# Patient Record
Sex: Male | Born: 1985 | Race: White | Hispanic: No | Marital: Married | Smoking: Former smoker
Health system: Southern US, Community
[De-identification: ages and names within clinical notes are randomized; demographics above are authoritative.]

## PROBLEM LIST (undated history)

## (undated) HISTORY — PX: HERNIA REPAIR: SHX51

---

## 2014-03-02 ENCOUNTER — Emergency Department (HOSPITAL_COMMUNITY): Payer: BC Managed Care – PPO

## 2014-03-02 ENCOUNTER — Emergency Department (HOSPITAL_COMMUNITY)
Admission: EM | Admit: 2014-03-02 | Discharge: 2014-03-03 | Disposition: A | Discharge status: Home / Self Care | Payer: BC Managed Care – PPO | Attending: Emergency Medicine | Admitting: Emergency Medicine

## 2014-03-02 ENCOUNTER — Encounter (HOSPITAL_COMMUNITY): Payer: Self-pay | Admitting: Emergency Medicine

## 2014-03-02 DIAGNOSIS — M778 Other enthesopathies, not elsewhere classified: Secondary | ICD-10-CM

## 2014-03-02 DIAGNOSIS — S6990XA Unspecified injury of unspecified wrist, hand and finger(s), initial encounter: Secondary | ICD-10-CM | POA: Diagnosis present

## 2014-03-02 DIAGNOSIS — W268XXA Contact with other sharp object(s), not elsewhere classified, initial encounter: Secondary | ICD-10-CM | POA: Insufficient documentation

## 2014-03-02 DIAGNOSIS — M65849 Other synovitis and tenosynovitis, unspecified hand: Secondary | ICD-10-CM

## 2014-03-02 DIAGNOSIS — Y9389 Activity, other specified: Secondary | ICD-10-CM | POA: Insufficient documentation

## 2014-03-02 DIAGNOSIS — S60552A Superficial foreign body of left hand, initial encounter: Secondary | ICD-10-CM

## 2014-03-02 DIAGNOSIS — M65839 Other synovitis and tenosynovitis, unspecified forearm: Secondary | ICD-10-CM | POA: Diagnosis not present

## 2014-03-02 DIAGNOSIS — Z87891 Personal history of nicotine dependence: Secondary | ICD-10-CM | POA: Insufficient documentation

## 2014-03-02 DIAGNOSIS — Y9289 Other specified places as the place of occurrence of the external cause: Secondary | ICD-10-CM | POA: Diagnosis not present

## 2014-03-02 DIAGNOSIS — M779 Enthesopathy, unspecified: Secondary | ICD-10-CM

## 2014-03-02 DIAGNOSIS — S60459A Superficial foreign body of unspecified finger, initial encounter: Secondary | ICD-10-CM | POA: Diagnosis not present

## 2014-03-02 NOTE — ED Notes (Signed)
Pt reports was cutting wood 3.5 years ago and piece of axe broke off and embedded into L thumb; pt reports it has been there since but never caused problems until now; having difficulty moving thumb

## 2014-03-02 NOTE — ED Provider Notes (Signed)
CSN: [id number]     Arrival date & time 03/02/14  2211 History  This chart was scribed for non-physician practitioner, Dierdre Forth PA-C, working with Toy Baker, MD by Milly Jakob, ED Scribe. The patient was seen in room TR10C/TR10C. Patient's care was started at 11:40 PM.  Chief Complaint  Patient presents with  . Hand Pain   The history is provided by the patient and medical records. No language interpreter was used.   HPI Comments: Brandon Shea is a 28 y.o. male who presents to the Emergency Department complaining of pain and swelling in his left hand where he has a piece of steel that has been lodged near his thumb. He reports that he was chopping wood and the tip of an axe broke off and became trapped in his hand 3 years ago. He states that it has not been painful in the past, but this past week it has become more swollen and acutely painful. He denies known trauma.  He reports that pain shoots through his hand upon palpation or bending. He denies any medical problems.    History reviewed. No pertinent past medical history. Past Surgical History  Procedure Laterality Date  . Hernia repair     History reviewed. No pertinent family history. History  Substance Use Topics  . Smoking status: Former Smoker    Quit date: 03/02/2005  . Smokeless tobacco: Current User    Types: Chew  . Alcohol Use: Yes     Comment: occasionally    Review of Systems  Constitutional: Negative for fever and chills.  Gastrointestinal: Negative for nausea and vomiting.  Musculoskeletal: Positive for arthralgias (left thumb) and joint swelling. Negative for back pain, neck pain and neck stiffness.  Skin: Negative for wound.  Neurological: Negative for weakness and numbness.  Hematological: Does not bruise/bleed easily.  Psychiatric/Behavioral: The patient is not nervous/anxious.   All other systems reviewed and are negative.  Allergies  Aspirin  Home Medications   Prior to Admission  medications   Not on File   Triage Vitals: BP 128/74  Pulse 79  Temp(Src) 98.3 F (36.8 C)  Resp 18  Ht 6' (1.829 m)  Wt 210 lb (95.255 kg)  BMI 28.47 kg/m2  SpO2 96% Physical Exam  Nursing note and vitals reviewed. Constitutional: He appears well-developed and well-nourished. No distress.  HENT:  Head: Normocephalic and atraumatic.  Eyes: Conjunctivae are normal.  Neck: Normal range of motion.  Cardiovascular: Normal rate, regular rhythm, normal heart sounds and intact distal pulses.   No murmur heard. Capillary refill < 3 seconds.  Pulmonary/Chest: Effort normal and breath sounds normal.  Musculoskeletal: He exhibits tenderness. He exhibits no edema.  ROM: full range of motion to the dip, pip, and mcp of the left thumb. Palpable foreign body over the mcp w/ mild associated swelling. No erythema, induration, or increased warmth. No gross abscess.  Foreign body appears to be rubbing against the extensor tendon of the thumb Negative grind test to the right thumb  Neurological: He is alert. Coordination normal.  Sensation normal Strength 5/5 including resisted flexion and extension of the thumb. Strong and equal grip strength.  Skin: Skin is warm and dry. He is not diaphoretic.  No tenting of the skin  Psychiatric: He has a normal mood and affect.   ED Course  Procedures (including critical care time) DIAGNOSTIC STUDIES: Oxygen Saturation is 96% on room air, normal by my interpretation.    COORDINATION OF CARE: 11:46 PM-Discussed treatment plan  which includes f/u w/ a hand surgeon with pt at bedside and pt agreed to plan.   Labs Review Labs Reviewed - No data to display  Imaging Review Dg Hand Complete Left  03/02/2014   CLINICAL DATA:  Left hand pain.  EXAM: LEFT HAND - COMPLETE 3+ VIEW  COMPARISON:  None available.  FINDINGS: The joint spaces are maintained. No acute bony findings or degenerative changes. There is a metallic radiopaque foreign body near the base of the  thumb.  IMPRESSION: No acute bony findings.  Metallic foreign body noted near the base of the thumb.   Electronically Signed   By: Loralie ChampagneMark  Gallerani M.D.   On: 03/02/2014 23:04     EKG Interpretation None      MDM   Final diagnoses:  Foreign body, hand, superficial, left, initial encounter  Thumb tendonitis   Brandon PietyDonald Shea presents with 3 years of retained foreign body in the thumb with acutely worsening pain in the last week.  Patient without erythema or induration to suggest secondary infection, abscess.  Patient requests removal of the foreign body however based on its location secondary to the tendon do not feel this is safe for the emergency department. Patient pain likely secondary to tendinitis from tendon rubbing against the foreign body.  Full range of motion of all joints in the right hand. No evidence of septic joint.  Recommended patient followup with hand surgery when able for discussion of possible removal if it is giving him problems now.  In the meantime recommend ibuprofen 3 times per day with food to improve tendinitis.  Patient is to return to the emergency department for increasing erythema, increasing pain, fever or evidence of infection.  BP 109/52  Pulse 75  Temp(Src) 98.7 F (37.1 C) (Oral)  Resp 18  Ht 6' (1.829 m)  Wt 210 lb (95.255 kg)  BMI 28.47 kg/m2  SpO2 99%  I personally performed the services described in this documentation, which was scribed in my presence. The recorded information has been reviewed and is accurate.   Dahlia ClientHannah Jenniferlynn Saad, PA-C 03/03/14 0022

## 2014-03-02 NOTE — Discharge Instructions (Signed)
1. Medications: ibuprofen 800mg  three times per day with food, usual home medications 2. Treatment: rest, drink plenty of fluids,  3. Follow Up: Please followup with Dr. Mina MarbleWeingold to discuss possible removal of the FB    Tendinitis Tendinitis is swelling and inflammation of the tendons. Tendons are band-like tissues that connect muscle to bone. Tendinitis commonly occurs in the:   Shoulders (rotator cuff).  Heels (Achilles tendon).  Elbows (triceps tendon). CAUSES Tendinitis is usually caused by overusing the tendon, muscles, and joints involved. When the tissue surrounding a tendon (synovium) becomes inflamed, it is called tenosynovitis. Tendinitis commonly develops in people whose jobs require repetitive motions. SYMPTOMS  Pain.  Tenderness.  Mild swelling. DIAGNOSIS Tendinitis is usually diagnosed by physical exam. Your health care provider may also order X-rays or other imaging tests. TREATMENT Your health care provider may recommend certain medicines or exercises for your treatment. HOME CARE INSTRUCTIONS   Use a sling or splint for as long as directed by your health care provider until the pain decreases.  Put ice on the injured area.  Put ice in a plastic bag.  Place a towel between your skin and the bag.  Leave the ice on for 15-20 minutes, 3-4 times a day, or as directed by your health care provider.  Avoid using the limb while the tendon is painful. Perform gentle range of motion exercises only as directed by your health care provider. Stop exercises if pain or discomfort increase, unless directed otherwise by your health care provider.  Only take over-the-counter or prescription medicines for pain, discomfort, or fever as directed by your health care provider. SEEK MEDICAL CARE IF:   Your pain and swelling increase.  You develop new, unexplained symptoms, especially increased numbness in the hands. MAKE SURE YOU:   Understand these instructions.  Will  watch your condition.  Will get help right away if you are not doing well or get worse. Document Released: 06/28/2000 Document Revised: 11/15/2013 Document Reviewed: 09/17/2010 [facility] Patient Information 2015 [employer] [place]. This information is not intended to replace advice given to you by your health care provider. Make sure you discuss any questions you have with your health care provider.

## 2014-03-03 NOTE — ED Provider Notes (Signed)
Medical screening examination/treatment/procedure(s) were performed by non-physician practitioner and as supervising physician I was immediately available for consultation/collaboration.   Yanique Mulvihill T Jovan Colligan, MD 03/03/14 2258 

## 2015-03-07 IMAGING — CR DG HAND COMPLETE 3+V*L*
3 series · 3 of 3 positions shown · non-contrast
Comparison: None available.

CLINICAL DATA: Left hand pain.

EXAM:
LEFT HAND - COMPLETE 3+ VIEW

[x hand pa left *]
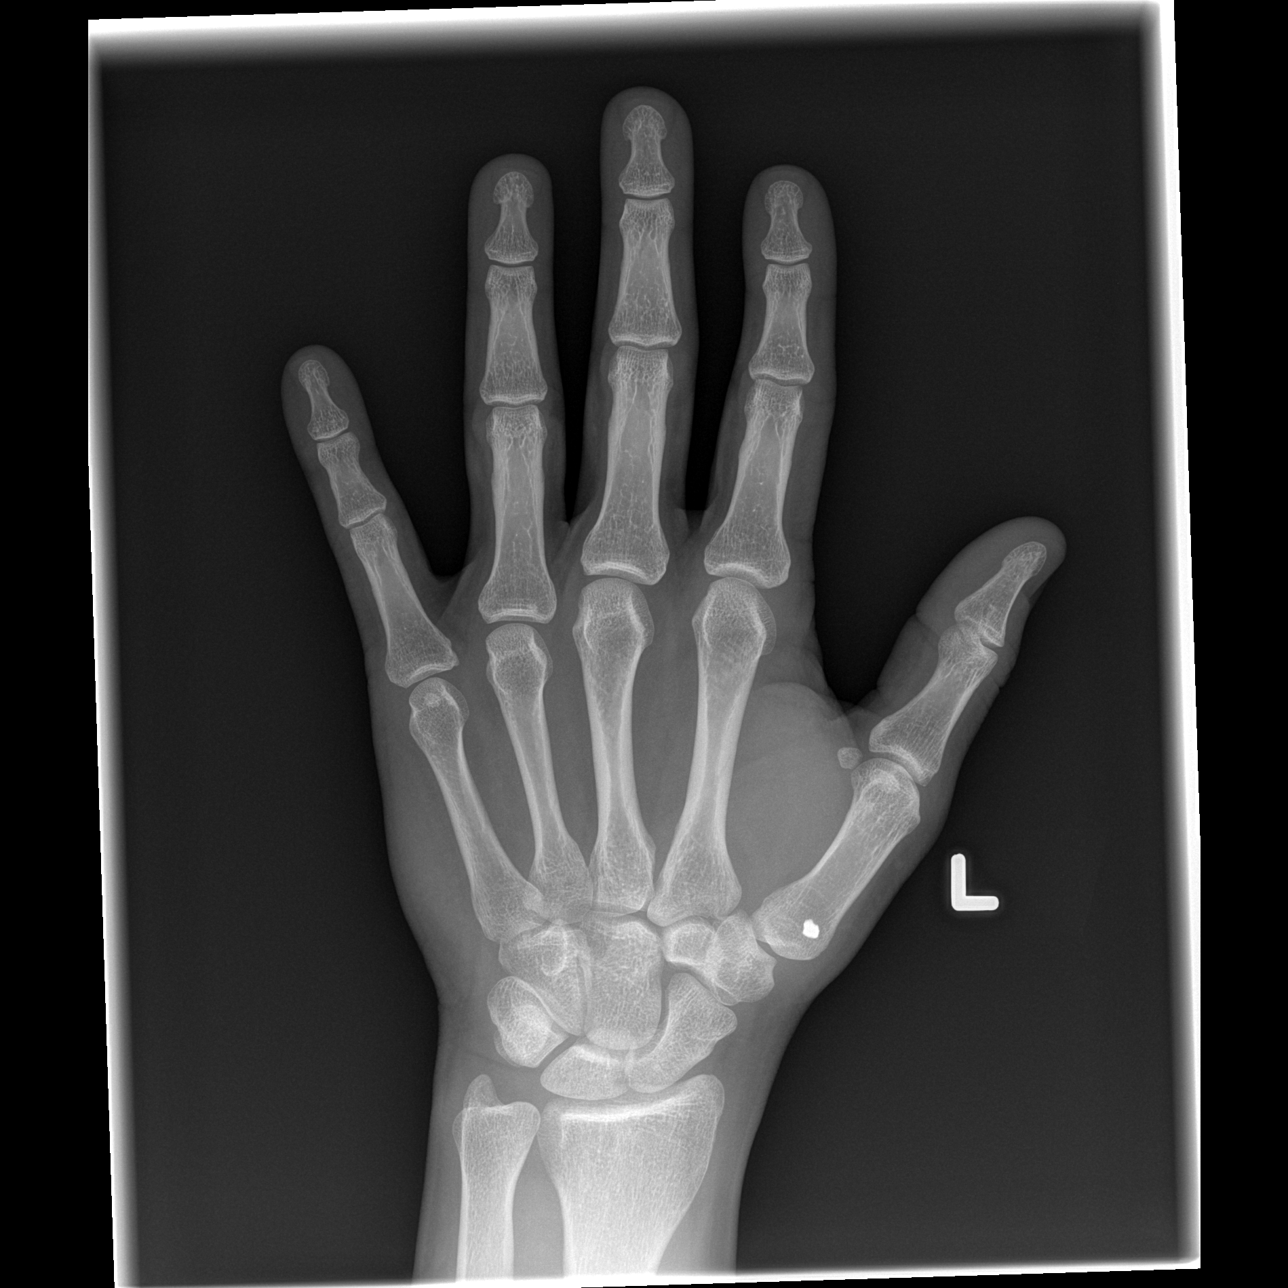

[x hand oblique left *]
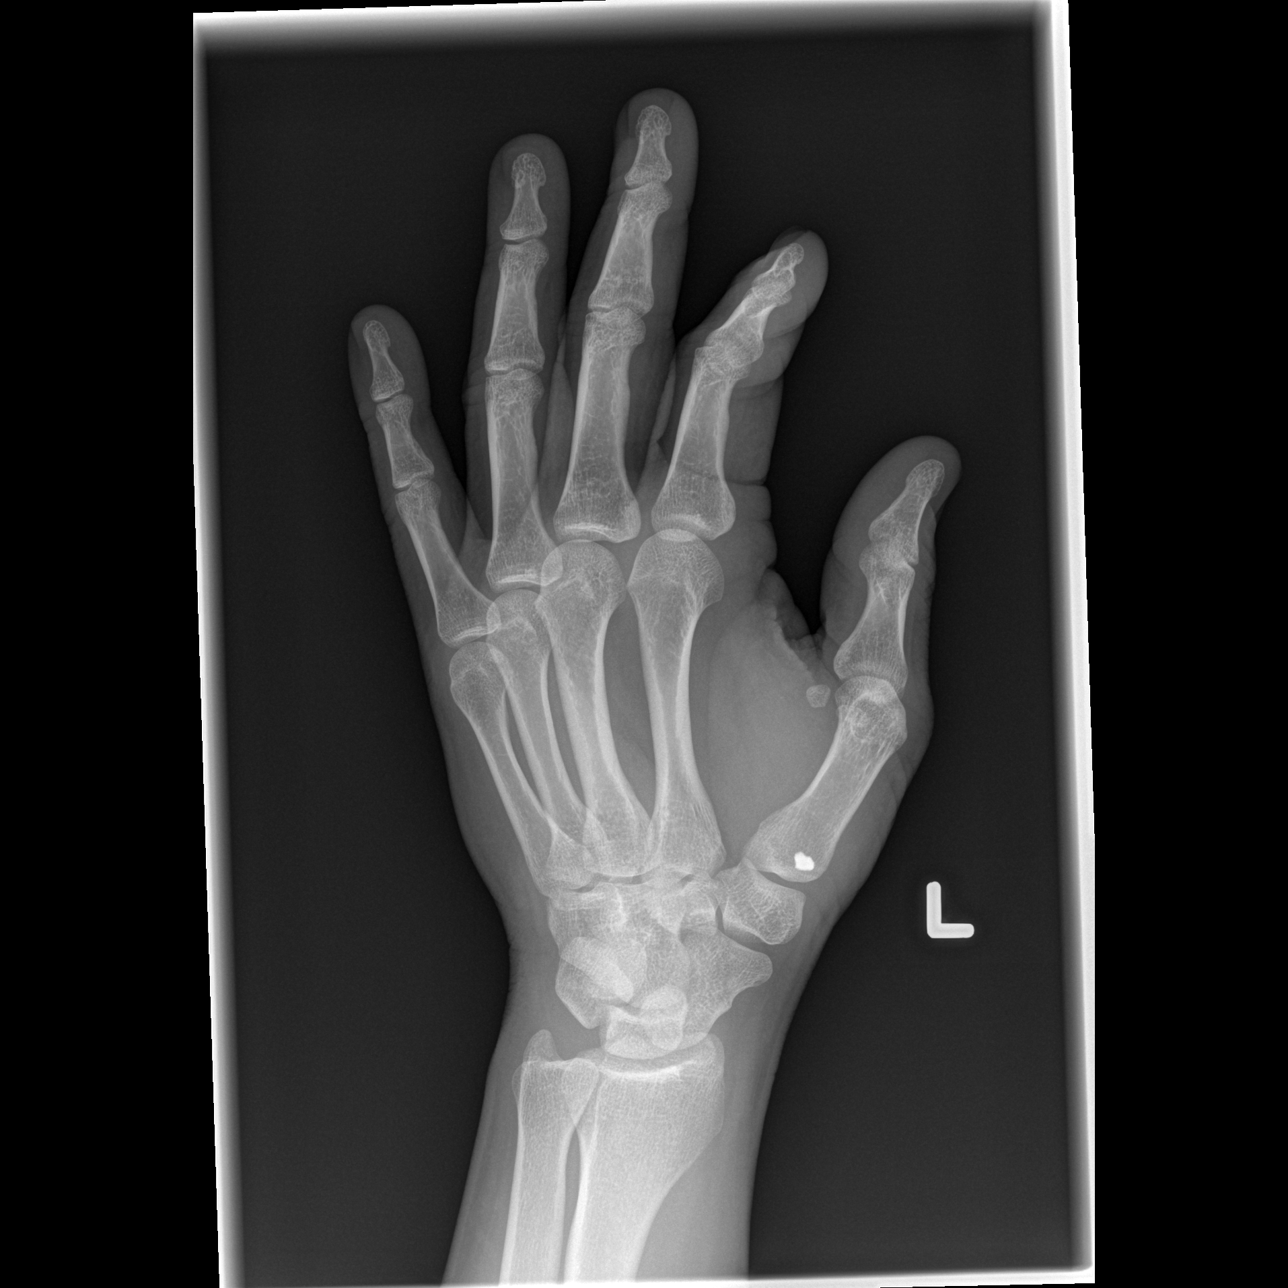

[x hand lat left *]
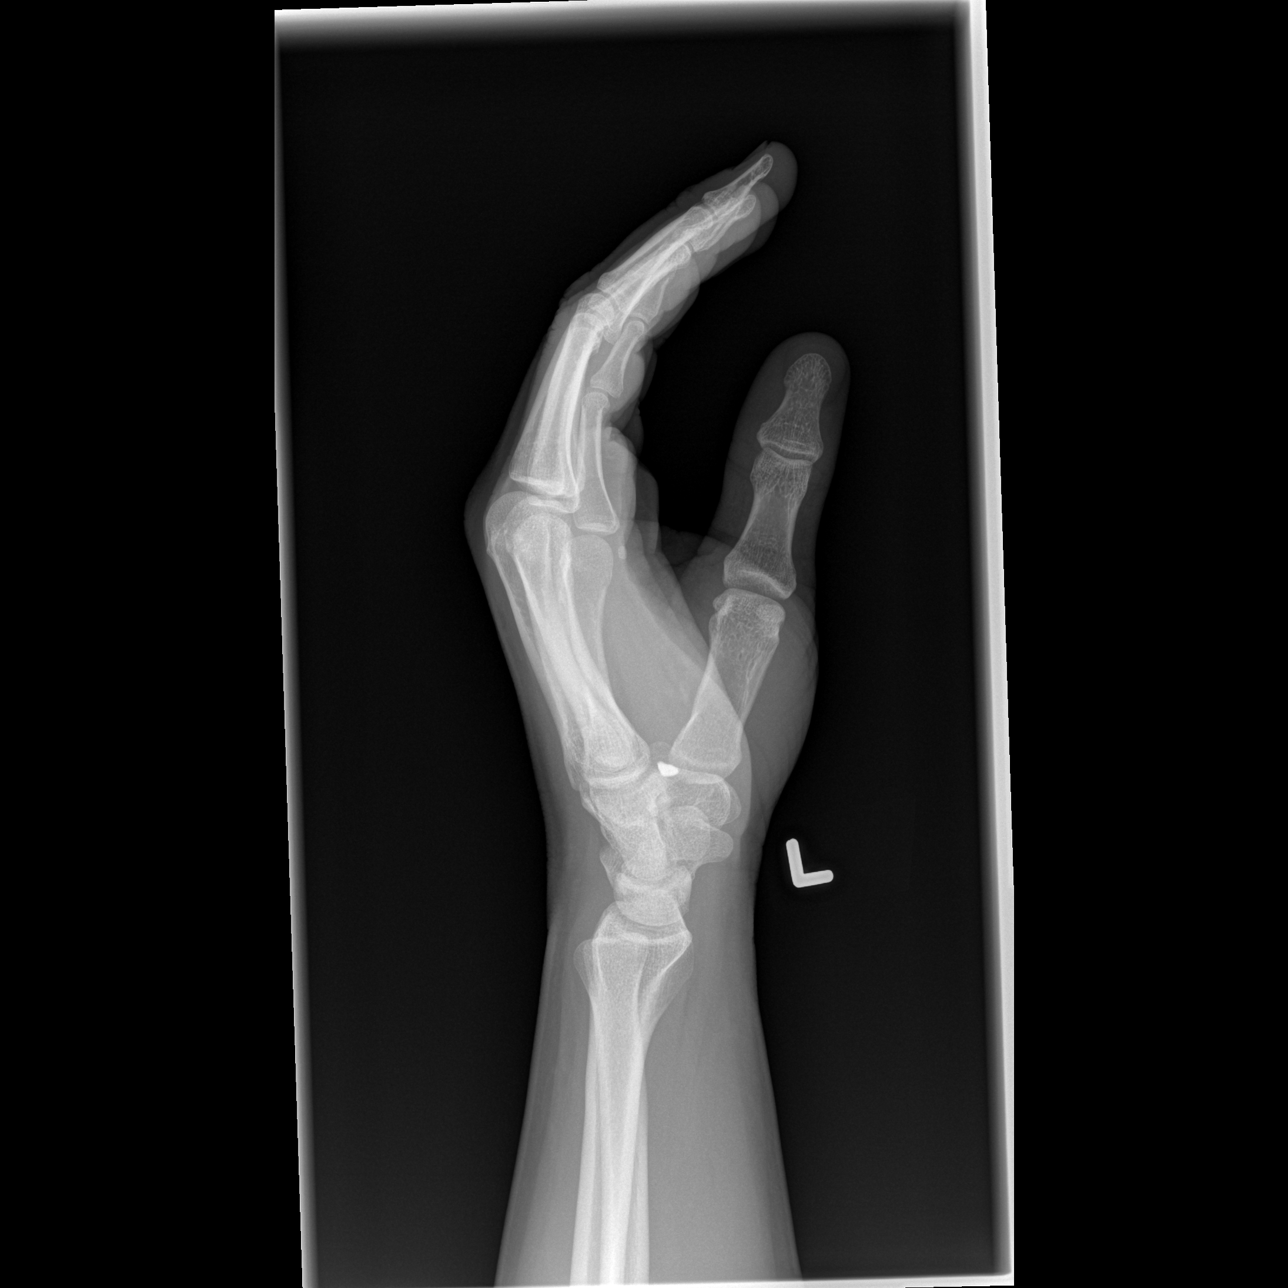

[3 of 3 positions shown; findings below may reference images not displayed]

FINDINGS: The joint spaces are maintained. No acute bony findings or
degenerative changes. There is a metallic radiopaque foreign body
near the base of the thumb.
IMPRESSION: No acute bony findings.

Metallic foreign body noted near the base of the thumb.

## 2018-05-08 ENCOUNTER — Encounter (HOSPITAL_COMMUNITY): Payer: Self-pay | Admitting: Emergency Medicine

## 2018-05-08 ENCOUNTER — Emergency Department (HOSPITAL_COMMUNITY)
Admission: EM | Admit: 2018-05-08 | Discharge: 2018-05-08 | Disposition: A | Discharge status: Home / Self Care | Payer: Self-pay | Attending: Emergency Medicine | Admitting: Emergency Medicine

## 2018-05-08 ENCOUNTER — Other Ambulatory Visit: Payer: Self-pay

## 2018-05-08 DIAGNOSIS — Z87891 Personal history of nicotine dependence: Secondary | ICD-10-CM | POA: Insufficient documentation

## 2018-05-08 DIAGNOSIS — Y9389 Activity, other specified: Secondary | ICD-10-CM | POA: Insufficient documentation

## 2018-05-08 DIAGNOSIS — Z23 Encounter for immunization: Secondary | ICD-10-CM | POA: Insufficient documentation

## 2018-05-08 DIAGNOSIS — W260XXA Contact with knife, initial encounter: Secondary | ICD-10-CM | POA: Insufficient documentation

## 2018-05-08 DIAGNOSIS — Y999 Unspecified external cause status: Secondary | ICD-10-CM | POA: Insufficient documentation

## 2018-05-08 DIAGNOSIS — Y929 Unspecified place or not applicable: Secondary | ICD-10-CM | POA: Insufficient documentation

## 2018-05-08 DIAGNOSIS — S61212A Laceration without foreign body of right middle finger without damage to nail, initial encounter: Secondary | ICD-10-CM | POA: Insufficient documentation

## 2018-05-08 MED ORDER — LIDOCAINE HCL (PF) 1 % IJ SOLN
10.0000 mL | Freq: Once | INTRAMUSCULAR | Status: AC
  Administered 2018-05-08: 10 mL
  Filled 2018-05-08: qty 30

## 2018-05-08 MED ORDER — TETANUS-DIPHTH-ACELL PERTUSSIS 5-2.5-18.5 LF-MCG/0.5 IM SUSP
0.5000 mL | Freq: Once | INTRAMUSCULAR | Status: AC
  Administered 2018-05-08: 0.5 mL via INTRAMUSCULAR
  Filled 2018-05-08: qty 0.5

## 2018-05-08 MED ORDER — BACITRACIN ZINC 500 UNIT/GM EX OINT
TOPICAL_OINTMENT | Freq: Once | CUTANEOUS | Status: AC
  Administered 2018-05-08: 1 via TOPICAL
  Filled 2018-05-08: qty 0.9

## 2018-05-08 NOTE — Discharge Instructions (Signed)
You were seen in the emergency department today for a laceration. Your laceration was closed with 4 stitches. Please keep this area clean and dry for the next 24 hours, after 24 hours you may get this area wet, but avoid soaking the area. Keep the area covered as best possible especially when in the sun to help in minimizing scarring.   Your tetanus has been updated   You will need to have the stitches removed and the wound rechecked in 7 days. Please return to the emergency department, go to an urgent care, or see your primary care provider to have this performed. Return to the ER soon should you start to experience pus type drainage from the wound, redness around the wound, or fevers as this could indicate the area is infected, please return to the ER for any other worsening symptoms or concerns that you may have.   

## 2018-05-08 NOTE — ED Provider Notes (Signed)
[facility]-EMERGENCY DEPT Provider Note   CSN: [id number] Arrival date & time: 05/08/18  1211     History   Chief Complaint Chief Complaint  Patient presents with  . Finger Injury    HPI Brandon Shea is a 32 y.o. male without significant past medical hx who presents to the ED with laceration to the R 3rd finger which occurred 10 minutes PTA. Patient states he was attempting to open a can with his pocket knife which he did not realize was not fully locked. He accidentally cut the distal aspect of the digit. States he applied pressure dressing with improvement in bleeding and came to the ER. Pain to laceration area is a 3-4/10 in severity without alleviating/aggravating factors. Denies numbness, weakness, or other areas of injury. Last tetanus unknown. Patient is R hand dominant.   HPI  History reviewed. No pertinent past medical history.  There are no active problems to display for this patient.   Past Surgical History:  Procedure Laterality Date  . HERNIA REPAIR          Home Medications    Prior to Admission medications   Not on File    Family History No family history on file.  Social History Social History   Tobacco Use  . Smoking status: Former Smoker    Last attempt to quit: 03/02/2005    Years since quitting: 13.1  . Smokeless tobacco: Current User    Types: Chew  Substance Use Topics  . Alcohol use: Yes    Comment: occasionally  . Drug use: No     Allergies   Aspirin   Review of Systems Review of Systems  Constitutional: Negative for chills and fever.  Skin: Positive for wound.  Neurological: Negative for weakness and numbness.     Physical Exam Updated Vital Signs BP (!) 123/51   Pulse 64   Temp 98.5 F (36.9 C)   Resp 17   SpO2 97%   Physical Exam  Constitutional: He appears well-developed and well-nourished. No distress.  HENT:  Head: Normocephalic and atraumatic.  Eyes: Conjunctivae are normal. Right  eye exhibits no discharge. Left eye exhibits no discharge.  Cardiovascular:  2+ symmetric radial pulse  Musculoskeletal:  There is a 2 cm laceration that occurs diagonally from the radial aspect to the palmar surface of the distal R finger.  Approximately 3 mm deep. No nailbed involvement. Mild bleeding controlled with pressure, no obvious FB.   Patient has full AROM to all IP/MCP joints of the upper extremities without bony tenderness to palpation.   Neurological: He is alert.  Clear speech. Sensation grossly intact to bilateral upper extremities. 5/5 symmetric grip strength. Able to perform OK sign, thumbs up, and cross 2nd/3rd digits.   Skin: Capillary refill takes less than 2 seconds.  Psychiatric: He has a normal mood and affect. His behavior is normal. Thought content normal.  Nursing note and vitals reviewed.    ED Treatments / Results  Labs (all labs ordered are listed, but only abnormal results are displayed) Labs Reviewed - No data to display  EKG None  Radiology No results found.  Procedures .Marland KitchenLaceration Repair Date/Time: 05/08/2018 12:36 PM Performed by: Cherly Anderson, PA-C Authorized by: Cherly Anderson, PA-C   Consent:    Consent obtained:  Verbal   Consent given by:  Patient   Risks discussed:  Infection, pain, poor cosmetic result, poor wound healing, nerve damage, need for additional repair, retained foreign body, tendon damage and  vascular damage   Alternatives discussed:  No treatment Anesthesia (see MAR for exact dosages):    Anesthesia method:  Nerve block   Block location:  3rd finger   Block needle gauge:  27 G   Block anesthetic:  Lidocaine 1% w/o epi   Block injection procedure:  Anatomic landmarks identified, anatomic landmarks palpated, introduced needle, negative aspiration for blood and incremental injection   Block outcome:  Anesthesia achieved Laceration details:    Location:  Finger   Finger location:  R long finger    Length (cm):  2   Depth (mm):  3 Repair type:    Repair type:  Simple Pre-procedure details:    Preparation:  Patient was prepped and draped in usual sterile fashion Exploration:    Hemostasis achieved with:  Direct pressure   Wound exploration: wound explored through full range of motion and entire depth of wound probed and visualized     Contaminated: no   Treatment:    Area cleansed with:  Betadine   Amount of cleaning:  Standard   Irrigation solution:  Sterile water   Irrigation volume:  1L   Irrigation method:  Pressure wash Skin repair:    Repair method:  Sutures   Suture size:  4-0   Suture material:  Nylon   Suture technique:  Simple interrupted   Number of sutures:  4 Approximation:    Approximation:  Close Post-procedure details:    Dressing:  Antibiotic ointment and non-adherent dressing   Patient tolerance of procedure:  Tolerated well, no immediate complications   (including critical care time)  Medications Ordered in ED Medications  bacitracin ointment (has no administration in time range)  lidocaine (PF) (XYLOCAINE) 1 % injection 10 mL (10 mLs Infiltration Given 05/08/18 1234)  Tdap (BOOSTRIX) injection 0.5 mL (0.5 mLs Intramuscular Given 05/08/18 1234)     Initial Impression / Assessment and Plan / ED Course  I have reviewed the triage vital signs and the nursing notes.  Pertinent labs & imaging results that were available during my care of the patient were reviewed by me and considered in my medical decision making (see chart for details).    Patient presents to the emergency department with laceration to R 3rd finger which occurred within 8 hours PTA . Patient nontoxic appearing, resting comfortably. Given mechanism and depth of wound without bony tenderness to palpation I have low suspicion for underlying fracture/dislocation. Pressure irrigation performed. Wound explored and base of wound visualized in a bloodless field without evidence of foreign body.  Laceration repair per procedure note above, tolerated well. Tetanus updated at today's visit. Do not feel that abx are indicated at this time based on wound appearance and lack of significant comorbidities. Discussed suture home care as well as need for wound recheck and suture removal in 7 days.  I discussed treatment plan, need for follow-up, and return precautions with the patient including signs of infection. Provided opportunity for questions, patient confirmed understanding and is in agreement with plan.     Final Clinical Impressions(s) / ED Diagnoses   Final diagnoses:  Laceration of right middle finger without foreign body without damage to nail, initial encounter    ED Discharge Orders    None       Cherly Anderson, PA-C 05/08/18 1304    Virgina Norfolk, DO 05/08/18 1506

## 2018-05-08 NOTE — ED Notes (Signed)
Bed: WTR5 Expected date:  Expected time:  Means of arrival:  Comments: 

## 2018-05-08 NOTE — ED Triage Notes (Signed)
Pt reports that he was opening something with pocket knife that he has done 1000s of times but cut his right middle finger with knife.
